# Patient Record
Sex: Male | Born: 2005 | Hispanic: Yes | Marital: Single | Smoking: Never smoker
Health system: Southern US, Community
[De-identification: ages and names within clinical notes are randomized; demographics above are authoritative.]

---
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
## 2024-01-25 DIAGNOSIS — Z419 Encounter for procedure for purposes other than remedying health state, unspecified: Secondary | ICD-10-CM | POA: Diagnosis not present

## 2024-03-07 DIAGNOSIS — Z419 Encounter for procedure for purposes other than remedying health state, unspecified: Secondary | ICD-10-CM | POA: Diagnosis not present

## 2024-04-06 DIAGNOSIS — Z419 Encounter for procedure for purposes other than remedying health state, unspecified: Secondary | ICD-10-CM | POA: Diagnosis not present

## 2024-05-07 DIAGNOSIS — Z419 Encounter for procedure for purposes other than remedying health state, unspecified: Secondary | ICD-10-CM | POA: Diagnosis not present

## 2024-06-06 DIAGNOSIS — Z419 Encounter for procedure for purposes other than remedying health state, unspecified: Secondary | ICD-10-CM | POA: Diagnosis not present

## 2024-07-07 DIAGNOSIS — S61512A Laceration without foreign body of left wrist, initial encounter: Secondary | ICD-10-CM | POA: Diagnosis not present

## 2024-07-07 DIAGNOSIS — Z419 Encounter for procedure for purposes other than remedying health state, unspecified: Secondary | ICD-10-CM | POA: Diagnosis not present

## 2024-08-07 DIAGNOSIS — Z419 Encounter for procedure for purposes other than remedying health state, unspecified: Secondary | ICD-10-CM | POA: Diagnosis not present
